# Patient Record
Sex: Female | Born: 1955 | Race: White | Hispanic: No | Smoking: Never smoker
Health system: Southern US, Community
[De-identification: ages and names within clinical notes are randomized; demographics above are authoritative.]

## PROBLEM LIST (undated history)

## (undated) DIAGNOSIS — F419 Anxiety disorder, unspecified: Secondary | ICD-10-CM

## (undated) DIAGNOSIS — E079 Disorder of thyroid, unspecified: Secondary | ICD-10-CM

## (undated) DIAGNOSIS — G43909 Migraine, unspecified, not intractable, without status migrainosus: Secondary | ICD-10-CM

---

## 1999-04-08 ENCOUNTER — Encounter: Admission: RE | Admit: 1999-04-08 | Discharge: 1999-04-08 | Payer: Self-pay | Admitting: Family Medicine

## 2008-08-03 ENCOUNTER — Ambulatory Visit: Payer: Self-pay | Admitting: Nurse Practitioner

## 2009-05-28 ENCOUNTER — Encounter: Admission: RE | Admit: 2009-05-28 | Discharge: 2009-05-28 | Payer: Self-pay | Admitting: Family Medicine

## 2010-06-04 ENCOUNTER — Encounter: Admission: RE | Admit: 2010-06-04 | Discharge: 2010-06-04 | Payer: Self-pay | Admitting: Family Medicine

## 2010-06-11 ENCOUNTER — Encounter: Admission: RE | Admit: 2010-06-11 | Discharge: 2010-06-11 | Payer: Self-pay | Admitting: Family Medicine

## 2010-09-05 ENCOUNTER — Encounter: Admission: RE | Admit: 2010-09-05 | Discharge: 2010-09-05 | Payer: Self-pay | Admitting: Family Medicine

## 2010-12-21 ENCOUNTER — Encounter
Admission: RE | Admit: 2010-12-21 | Discharge: 2010-12-21 | Payer: Self-pay | Source: Home / Self Care | Attending: Family Medicine | Admitting: Family Medicine

## 2010-12-29 ENCOUNTER — Encounter: Payer: Self-pay | Admitting: Family Medicine
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
## 2011-07-10 ENCOUNTER — Other Ambulatory Visit: Payer: Self-pay | Admitting: *Deleted

## 2011-07-10 DIAGNOSIS — N289 Disorder of kidney and ureter, unspecified: Secondary | ICD-10-CM

## 2011-07-11 ENCOUNTER — Other Ambulatory Visit: Payer: Self-pay | Admitting: Family Medicine

## 2011-07-11 DIAGNOSIS — Z1231 Encounter for screening mammogram for malignant neoplasm of breast: Secondary | ICD-10-CM

## 2011-07-12 IMAGING — OT DG DXA BONE DENSITY STUDY HL7
2 series · 2 of 2 positions shown · non-contrast
Comparison: None.

CLINICAL DATA: 54-year-old perimenopausal female who takes calcium
and vitamin D.  History of hypothyroidism.  The patient has been on
birth control pills for 3 years.

[Series 1: — · left · 1 of 1 slices shown (1 of 2)]
[im 1/1]
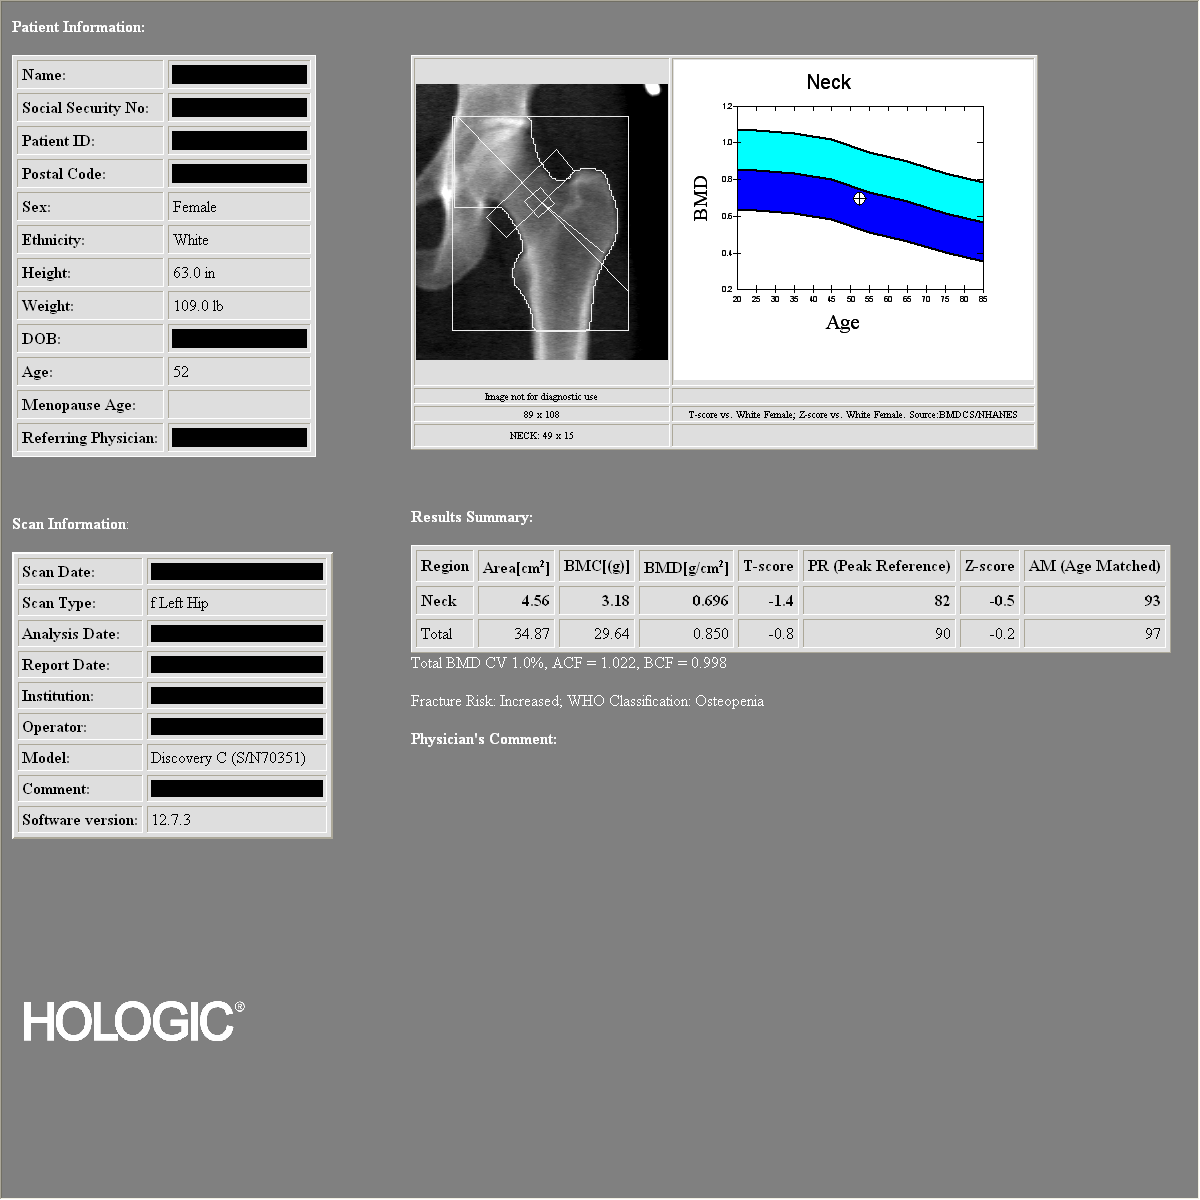

[Series 2: — · 1 of 1 slices shown (2 of 2)]
[im 1/1]
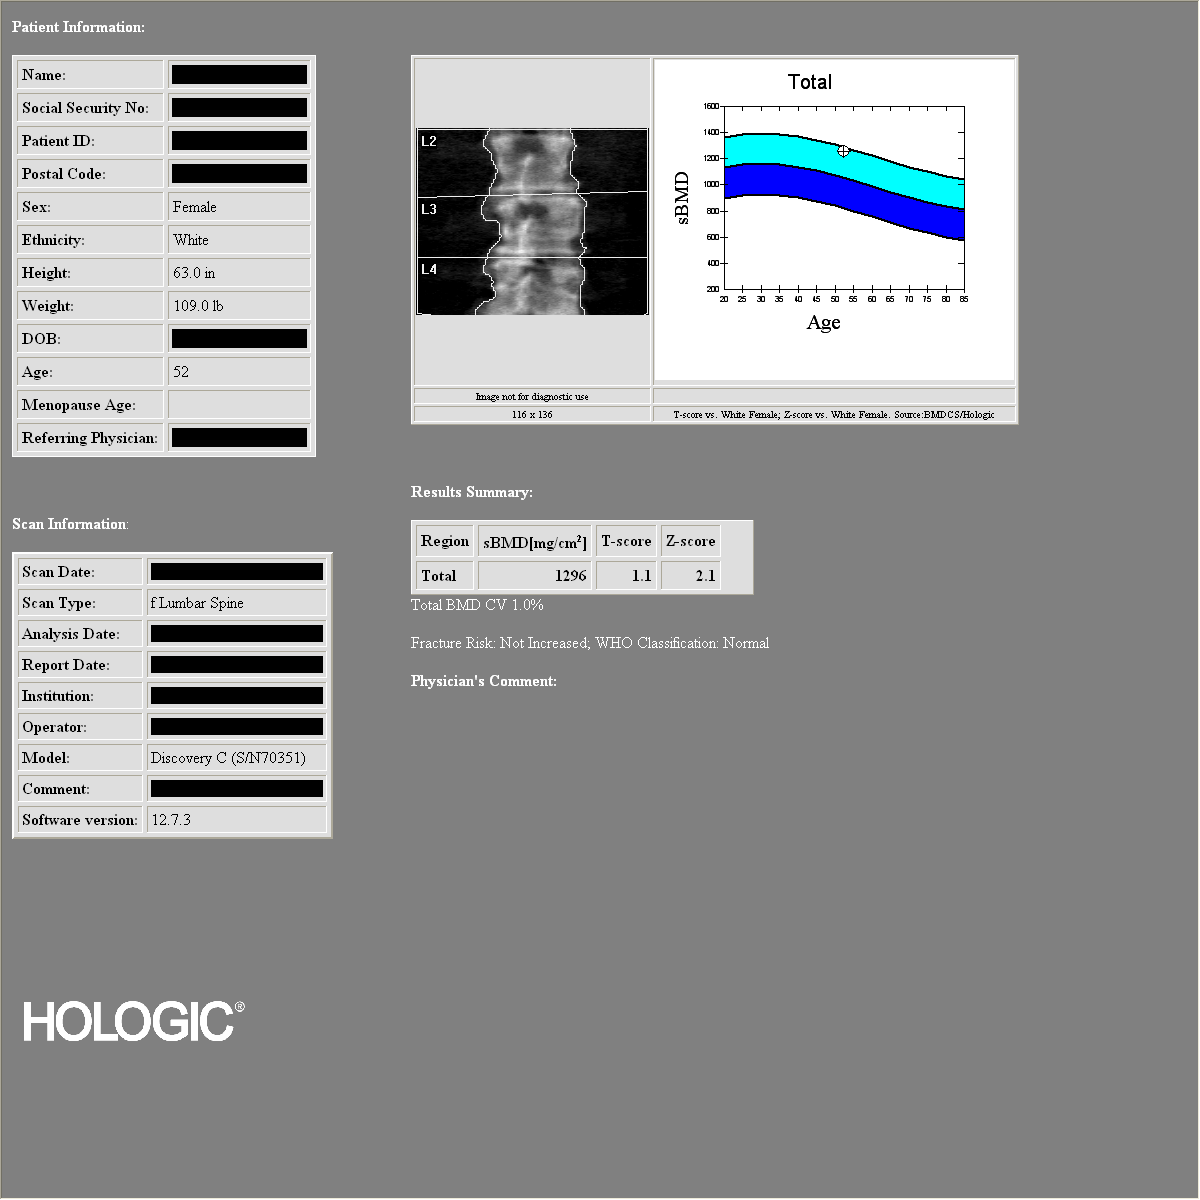

[2 of 2 positions shown; findings below may reference images not displayed]

DUAL X-RAY ABSORPTIOMETRY (DXA) FOR BONE MINERAL DENSITY

AP LUMBAR SPINE (L1 - L4)

Bone Mineral Density (BMD):            1.166 g/cm2
Young Adult T Score:
Z Score:

LEFT FEMUR (NECK)

Bone Mineral Density (BMD):             0.696 g/cm2
Young Adult T Score:                           -1.4
Z Score:                                                 -0.5

ASSESSMENT:  Patient's diagnostic category is LOW BONE MASS by WHO
Criteria.

FRACTURE RISK: MODERATE

FRAX: World Health Organization FRAX assessment of absolute
fracture risk is not calculated for this patient because the
patient is not yet postmenopausal.
RECOMMENDATIONS:

Effective therapies are available in the form of bisphosphonates,
selective estrogen receptor modulators, biologic agents, and
hormone replacement therapy (for women).  All patients should
ensure an adequate intake of dietary calcium (1200mg daily) and
vitamin D ([address]) unless contraindicated.

All treatment decisions require clinical judgement and
consideration of individual patient factors, including patient
preferences, co-morbidities, previous drug use, risk factors not
captured in the FRAX model (e.g., frailty, falls, vitamin D
deficiency, increased bone turnover, interval significant decline
in bone density) and possible under-or over-estimation of fracture
risk by FRAX.

The National Osteoporosis Foundation recommends that FDA-approved
medical therapies be considered in postmenopausal women and mean
age 50 or older with a:

      1)     Hip or vertebral (clinical or morphometric) fracture.

2)    T-score of -2.5 or lower at the spine or hip.
3)    Ten-year fracture probability by FRAX of 3% or greater for
hip fracture or 20% or greater for major osteoporotic fracture.
FOLLOW-UP:

People with diagnosed cases of osteoporosis or at high risk for
fracture should have regular bone mineral density tests.  For
patients eligible for Medicare, routine testing is allowed once
every 2 years.  The testing frequency can be increased to one year
for patients who have rapidly progressing disease, those who are
receiving or discontinuing medical therapy to restore bone mass, or
have additional risk factors.

World Health Organization (WHO) Criteria:

Normal: T scores from +1.0 to -1.0
Low Bone Mass (Osteopenia): T scores between -1.0 and -2.5
Osteoporosis: T scores -2.5 and below

Comparison to Reference Population:

T score is the key measure used in the diagnosis of osteoporosis
and relative risk determination for fracture.  It provides a value
for bone mass relative to the mean bone mass of a young adult
reference population expressed in terms of standard deviation (SD).

Z score is the age-matched score showing the patient's values
compared to a population matched for age, sex, and race.  This is
also expressed in terms of standard deviation.  The patient may
have values that compare favorably to the age-matched values and
still be at increased risk for fracture.

## 2011-07-14 ENCOUNTER — Other Ambulatory Visit: Payer: Self-pay

## 2011-07-15 ENCOUNTER — Ambulatory Visit
Admission: RE | Admit: 2011-07-15 | Discharge: 2011-07-15 | Disposition: A | Discharge status: Home / Self Care | Payer: BC Managed Care – PPO | Source: Ambulatory Visit | Attending: *Deleted | Admitting: *Deleted

## 2011-07-15 ENCOUNTER — Ambulatory Visit
Admission: RE | Admit: 2011-07-15 | Discharge: 2011-07-15 | Disposition: A | Discharge status: Home / Self Care | Payer: BC Managed Care – PPO | Source: Ambulatory Visit | Attending: Family Medicine | Admitting: Family Medicine

## 2011-07-15 DIAGNOSIS — Z1231 Encounter for screening mammogram for malignant neoplasm of breast: Secondary | ICD-10-CM

## 2011-07-15 DIAGNOSIS — N289 Disorder of kidney and ureter, unspecified: Secondary | ICD-10-CM

## 2012-06-16 ENCOUNTER — Other Ambulatory Visit: Payer: Self-pay | Admitting: Family Medicine

## 2012-06-16 DIAGNOSIS — Z1231 Encounter for screening mammogram for malignant neoplasm of breast: Secondary | ICD-10-CM

## 2012-06-24 ENCOUNTER — Other Ambulatory Visit: Payer: Self-pay | Admitting: Family Medicine

## 2012-06-24 ENCOUNTER — Ambulatory Visit (INDEPENDENT_AMBULATORY_CARE_PROVIDER_SITE_OTHER): Payer: BC Managed Care – PPO

## 2012-06-24 DIAGNOSIS — N939 Abnormal uterine and vaginal bleeding, unspecified: Secondary | ICD-10-CM

## 2012-06-24 DIAGNOSIS — N926 Irregular menstruation, unspecified: Secondary | ICD-10-CM

## 2012-08-03 ENCOUNTER — Ambulatory Visit: Payer: BC Managed Care – PPO

## 2012-08-10 ENCOUNTER — Ambulatory Visit: Payer: BC Managed Care – PPO

## 2012-08-17 ENCOUNTER — Ambulatory Visit: Payer: BC Managed Care – PPO

## 2012-12-13 ENCOUNTER — Ambulatory Visit
Admission: RE | Admit: 2012-12-13 | Discharge: 2012-12-13 | Disposition: A | Discharge status: Home / Self Care | Payer: BC Managed Care – PPO | Source: Ambulatory Visit | Attending: Family Medicine | Admitting: Family Medicine

## 2012-12-13 ENCOUNTER — Other Ambulatory Visit: Payer: Self-pay | Admitting: Family Medicine

## 2012-12-13 ENCOUNTER — Ambulatory Visit: Payer: BC Managed Care – PPO

## 2012-12-13 ENCOUNTER — Ambulatory Visit: Admission: RE | Admit: 2012-12-13 | Payer: BC Managed Care – PPO | Source: Ambulatory Visit

## 2012-12-13 DIAGNOSIS — Z1231 Encounter for screening mammogram for malignant neoplasm of breast: Secondary | ICD-10-CM

## 2012-12-20 ENCOUNTER — Ambulatory Visit: Payer: BC Managed Care – PPO

## 2013-07-25 IMAGING — US US TRANSVAGINAL NON-OB
1 series · 13 of 25 positions shown · non-contrast
Comparison: None

CLINICAL DATA: Abnormal vaginal bleeding.  Post menopausal.



[Series 1: us transvaginal non-ob · 0.28mm/px · 13 of 40 slices shown]
[im 1/40]
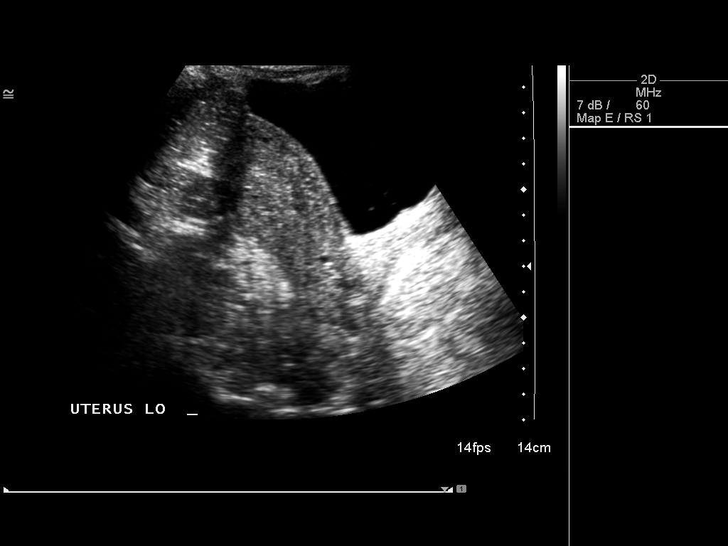
[im 4/40]
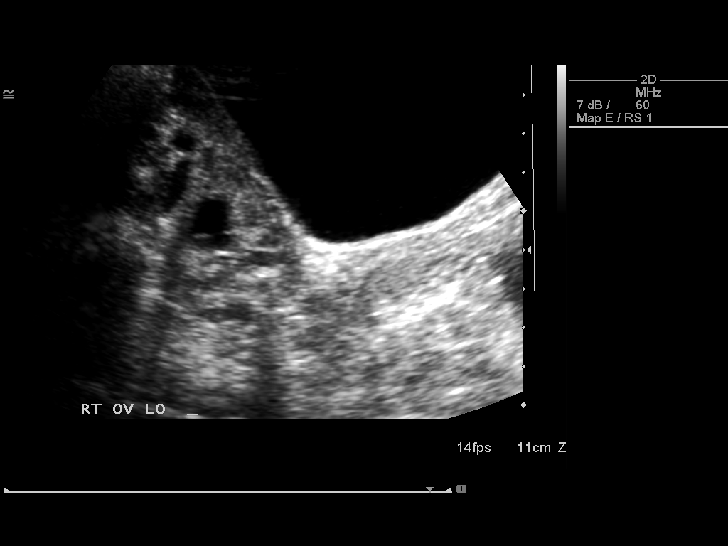
[im 7/40]
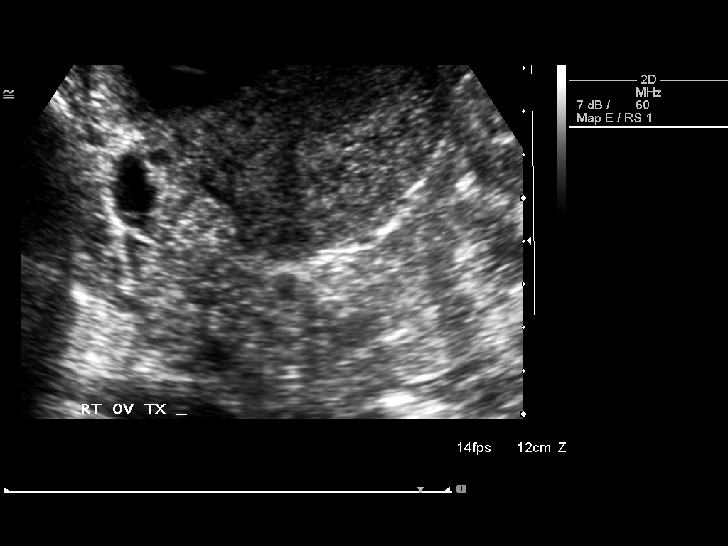
[im 10/40]
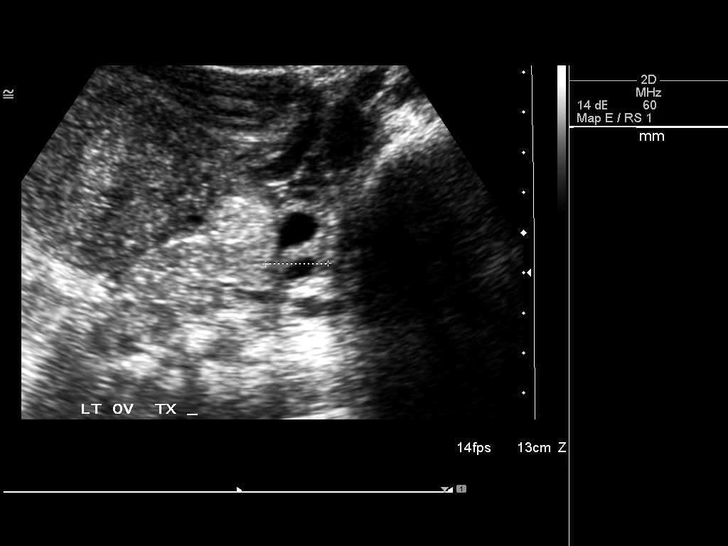
[im 14/40]
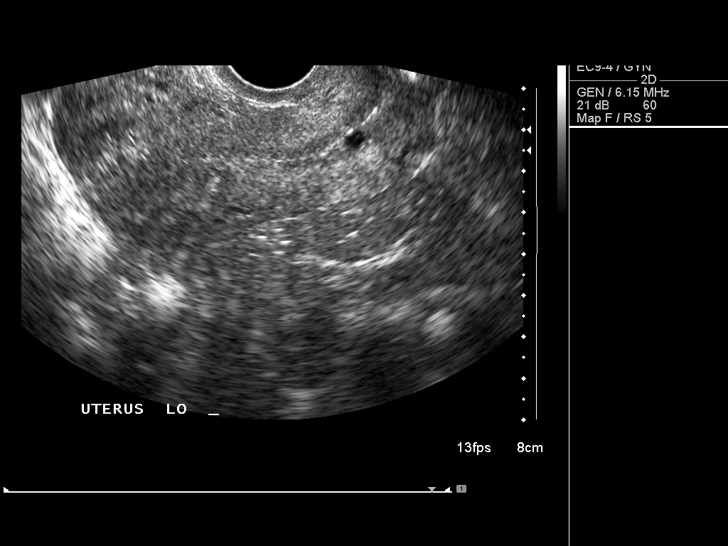
[im 17/40]
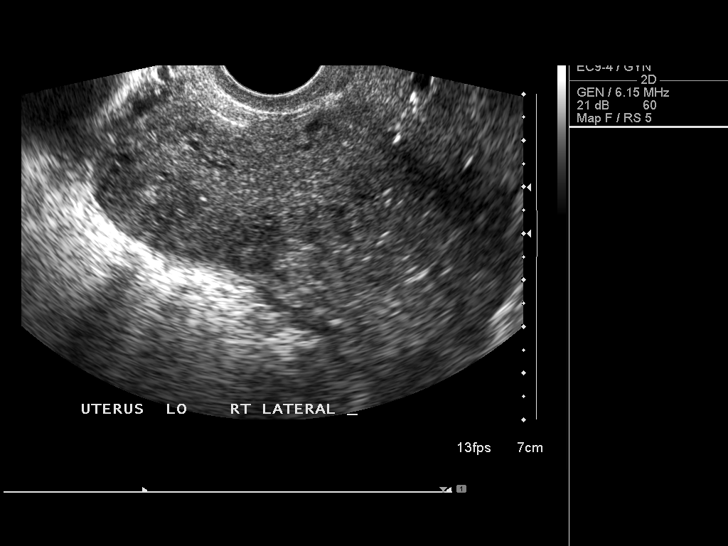
[im 20/40]
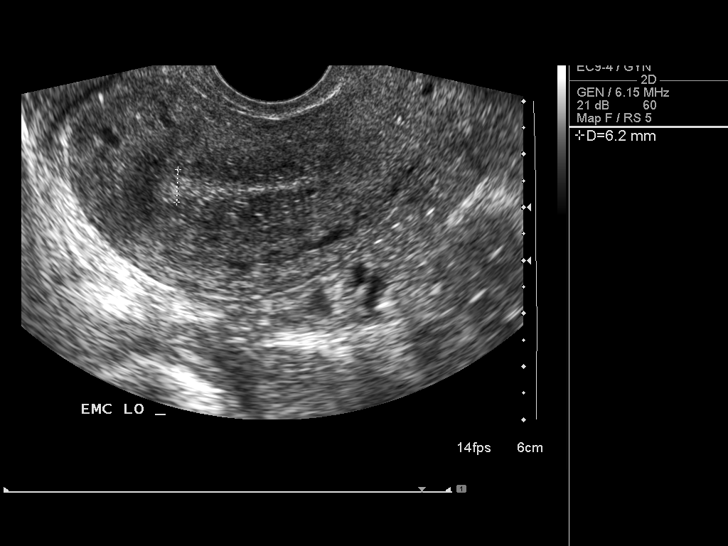
[im 23/40]
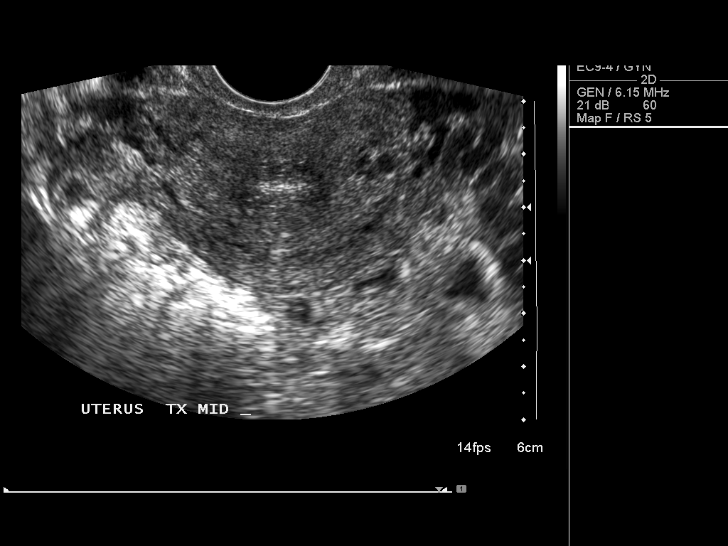
[im 27/40]
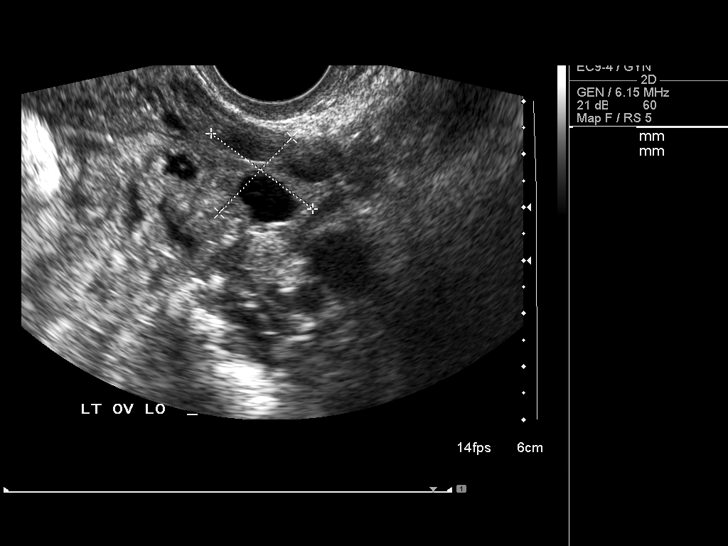
[im 30/40]
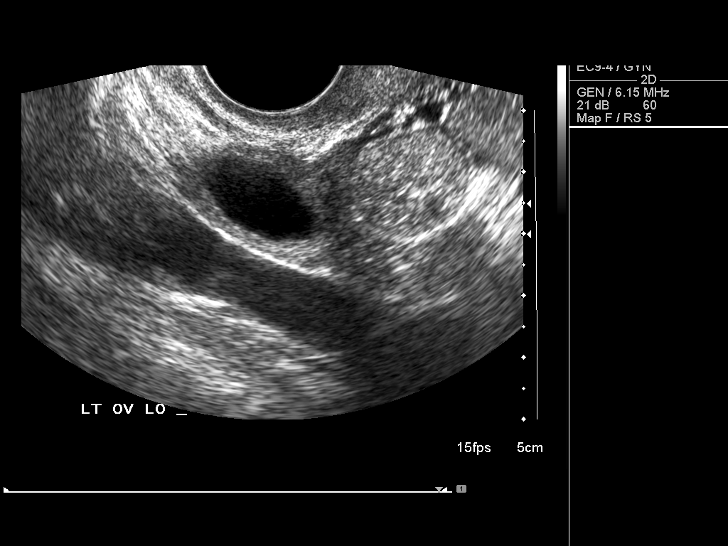
[im 33/40]
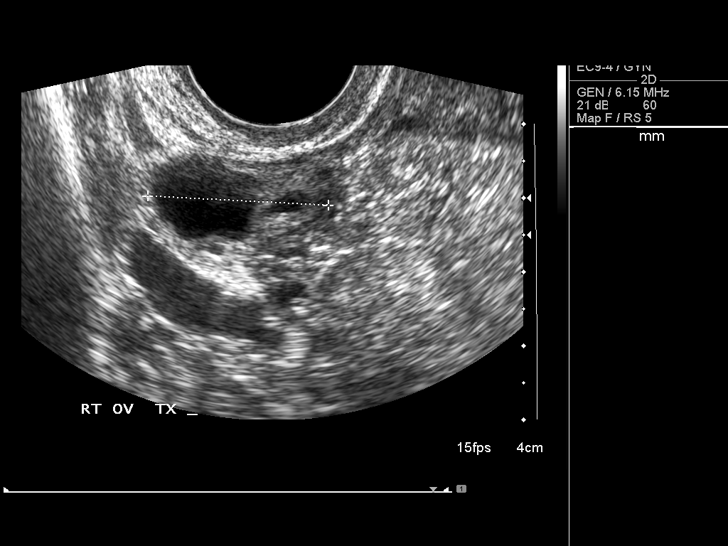
[im 36/40]
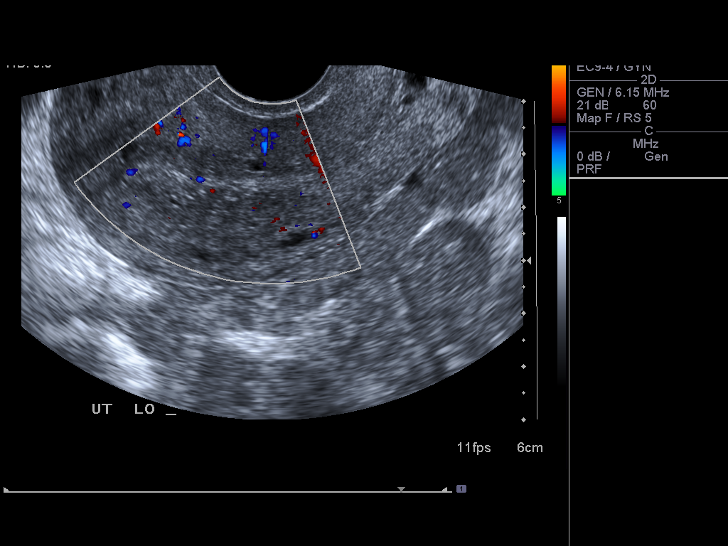
[im 40/40]
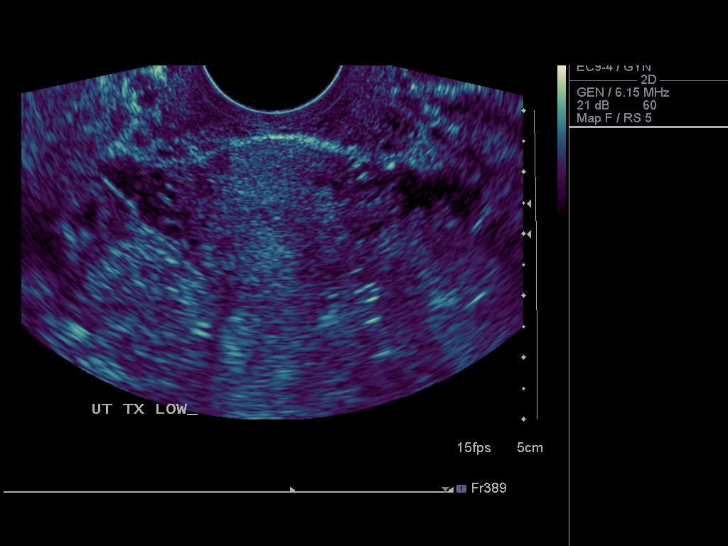

[13 of 25 positions shown; findings below may reference images not displayed]

FINDINGS: Uterus: 9.3 x 4.7 x 6.1 cm. Normal in morphology.

Endometrium: Within normal limits in the body.  Within the uterine
fundus, focally prominent at between eight and 12 mm on images 41
and 38.  Mild endometrial heterogeneity within this region.

Right ovary:  3.1 x 1.8 x 2.4 cm.  Numerous follicles within.

Left ovary: 2.4 x 2.0-0.5 cm.  Numerous follicles identified
within.

Other findings: No free fluid
IMPRESSION: Focal prominence of the endometrium in the uterine fundus at
between 8 and 12 mm.  If the patient it is post menopausal with
bleeding, this is abnormal.  Considerations include polyp,
hyperplasia, or less likely carcinoma.  Further evaluation with
sampling or sonohysterogram would be suggested.  If the patient is
premenopausal or perimenopausal, this could be within normal
variation and follow-up performed.  The clinical history describes
the patient as post menopausal, but the ovarian size and morphology
suggest premenopausal or perimenopausal status. .

## 2014-02-16 ENCOUNTER — Other Ambulatory Visit: Payer: Self-pay

## 2014-02-16 DIAGNOSIS — Z1231 Encounter for screening mammogram for malignant neoplasm of breast: Secondary | ICD-10-CM

## 2020-02-19 ENCOUNTER — Encounter: Payer: Self-pay | Admitting: Emergency Medicine

## 2020-02-19 ENCOUNTER — Emergency Department (INDEPENDENT_AMBULATORY_CARE_PROVIDER_SITE_OTHER): Payer: BC Managed Care – PPO

## 2020-02-19 ENCOUNTER — Emergency Department
Admission: EM | Admit: 2020-02-19 | Discharge: 2020-02-19 | Disposition: A | Discharge status: Home / Self Care | Payer: BC Managed Care – PPO | Source: Home / Self Care

## 2020-02-19 ENCOUNTER — Other Ambulatory Visit: Payer: Self-pay

## 2020-02-19 DIAGNOSIS — R0781 Pleurodynia: Secondary | ICD-10-CM

## 2020-02-19 DIAGNOSIS — S20211A Contusion of right front wall of thorax, initial encounter: Secondary | ICD-10-CM

## 2020-02-19 HISTORY — DX: Anxiety disorder, unspecified: F41.9

## 2020-02-19 HISTORY — DX: Disorder of thyroid, unspecified: E07.9

## 2020-02-19 HISTORY — DX: Migraine, unspecified, not intractable, without status migrainosus: G43.909

## 2020-02-19 NOTE — ED Triage Notes (Signed)
Patient reports falling forward over edge of truck sidewall and catching herself along right breast and underlying ribs; now painful to take deep breath and to twist side to side. Took ibuprofen 400mg  po 2 hours ago after incident. Has had influenza vacc this season; has had 1st covid immunization. No known exposure to covid positive person.

## 2020-02-19 NOTE — ED Provider Notes (Signed)
[facility]    CSN: [id number] Arrival date & time: 02/19/20  1510      History   Chief Complaint Chief Complaint  Patient presents with  . Breast Problem  . Chest Pain    ribs    HPI Kristin Gordon is a 64 y.o. female.   Patient suffered a fall while loading some furniture in the back of a dump truck.  She stepped on a folding chair that folded up and she landed on her right chest/breast/ribs.  Now hurts to take a deep breath.  HPI  No past medical history on file.  There are no problems to display for this patient.   No past surgical history on file.  OB History   No obstetric history on file.      Home Medications    Prior to Admission medications   Not on File    Family History No family history on file.  Social History Social History   Tobacco Use  . Smoking status: Not on file  Substance Use Topics  . Alcohol use: Not on file  . Drug use: Not on file     Allergies   Patient has no known allergies.   Review of Systems Review of Systems  Cardiovascular: Positive for chest pain.  All other systems reviewed and are negative.    Physical Exam Triage Vital Signs ED Triage Vitals  Enc Vitals Group     BP 02/19/20 1531 138/78     Pulse Rate 02/19/20 1531 61     Resp 02/19/20 1531 (!) 160     Temp 02/19/20 1531 98.4 F (36.9 C)     Temp Source 02/19/20 1531 Oral     SpO2 02/19/20 1531 100 %     Weight 02/19/20 1533 101 lb (45.8 kg)     Height 02/19/20 1533 5\' 3"  (1.6 m)     Head Circumference --      Peak Flow --      Pain Score 02/19/20 1532 8     Pain Loc --      Pain Edu? --      Excl. in [place]? --    No data found.  Updated Vital Signs BP 138/78 (BP Location: Right Arm)   Pulse 61   Temp 98.4 F (36.9 C) (Oral)   Resp (!) 160   Ht 5\' 3"  (1.6 m)   Wt 45.8 kg   SpO2 100%   BMI 17.89 kg/m   Visual Acuity Right Eye Distance:   Left Eye Distance:   Bilateral Distance:    Right Eye Near:   Left Eye Near:      Bilateral Near:     Physical Exam Vitals and nursing note reviewed.  Constitutional:      Appearance: She is well-developed.  Pulmonary:     Effort: Pulmonary effort is normal.  Chest:     Comments: Tender right side anteriorly.  No bruising noted.  There is tenderness with compression of the ribs laterally. Breath sounds are normal Neurological:     Mental Status: She is alert.      UC Treatments / Results  Labs (all labs ordered are listed, but only abnormal results are displayed) Labs Reviewed - No data to display  EKG   Radiology chest x-ray shows no evidence of rib fracture No results found.  Procedures Procedures (including critical care time)  Medications Ordered in UC Medications - No data to display  Initial Impression / Assessment  and Plan / UC Course  I have reviewed the triage vital signs and the nursing notes.  Pertinent labs & imaging results that were available during my care of the patient were reviewed by me and considered in my medical decision making (see chart for details).     Contusion right chest wall Final Clinical Impressions(s) / UC Diagnoses   Final diagnoses:  None   Discharge Instructions   None    ED Prescriptions    None     PDMP not reviewed this encounter.   Frederica Kuster, MD 02/19/20 [phone]

## 2021-03-21 IMAGING — DX DG RIBS W/ CHEST 3+V*R*
3 series · 3 of 3 positions shown · non-contrast
Comparison: None.

CLINICAL DATA: Fall, right rib pain

EXAM:
RIGHT RIBS AND CHEST - 3+ VIEW

[chest pa]
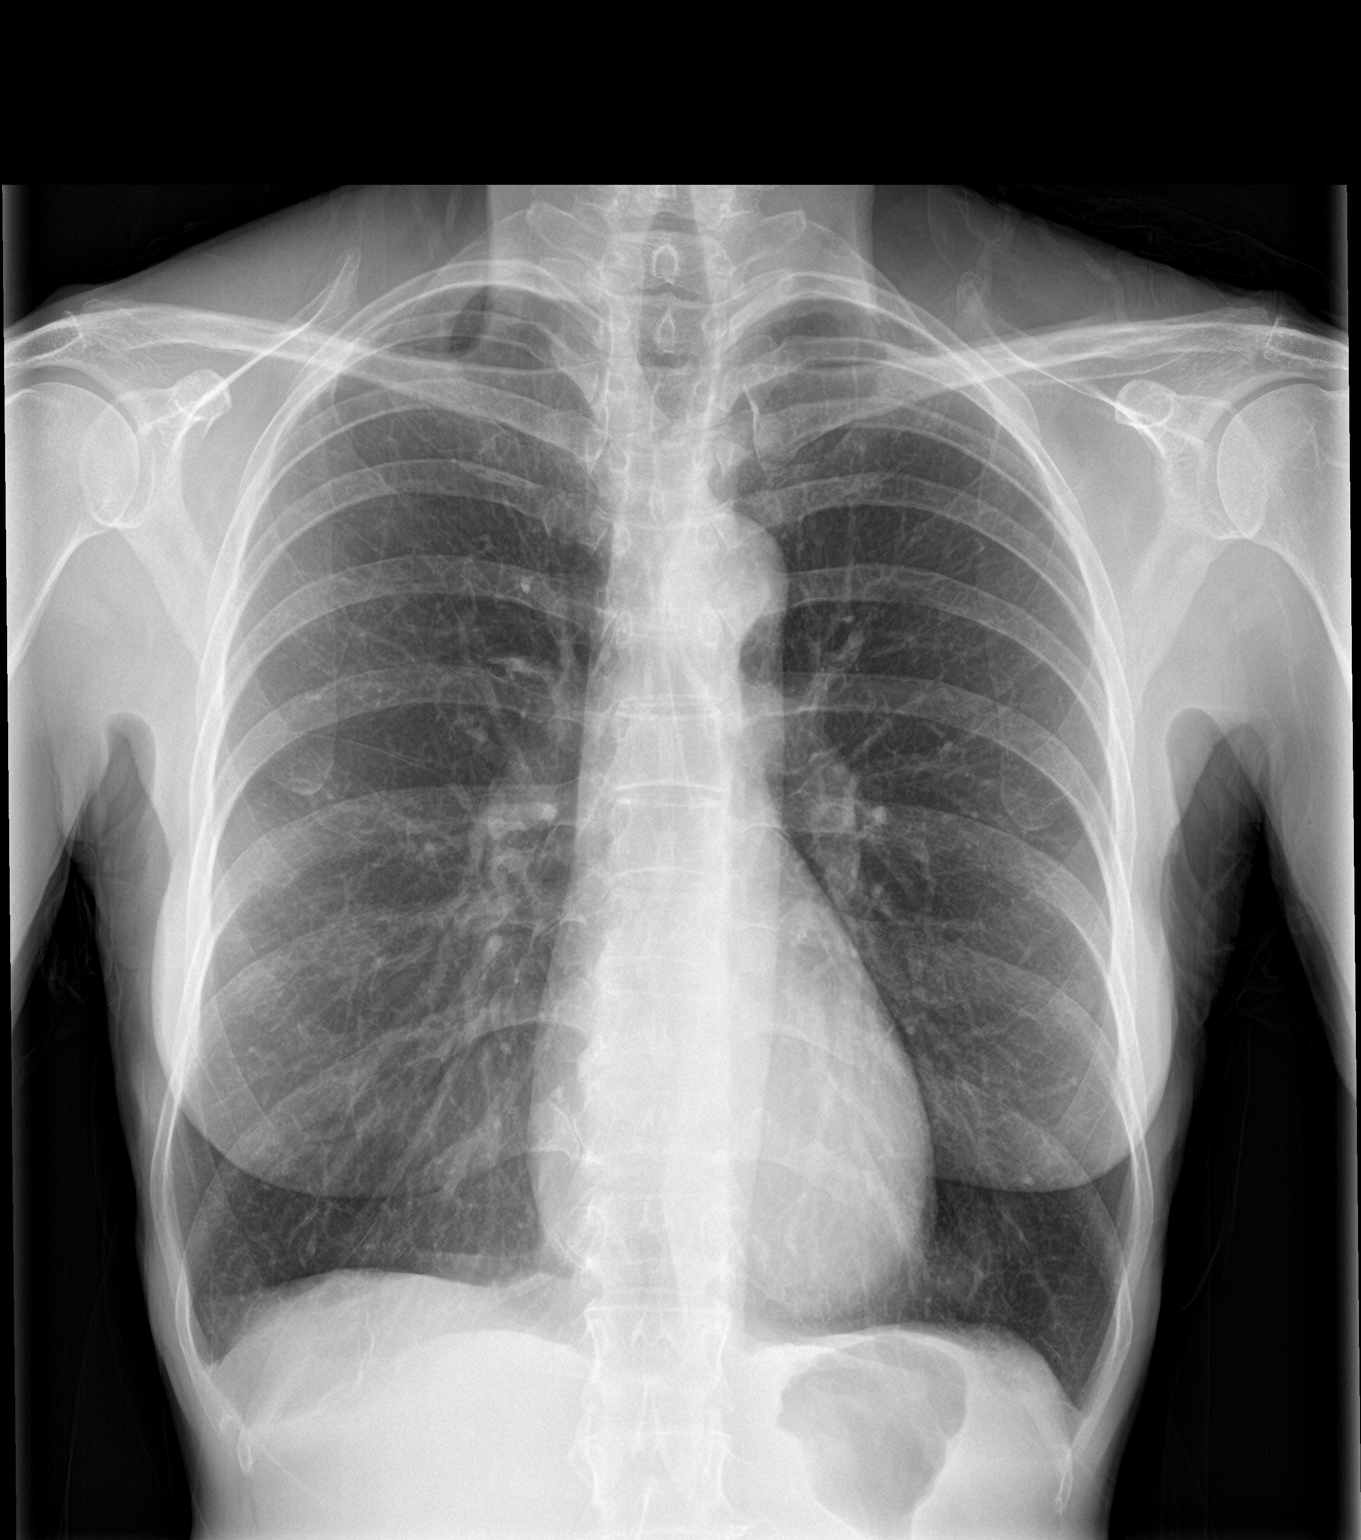

[rib pa]
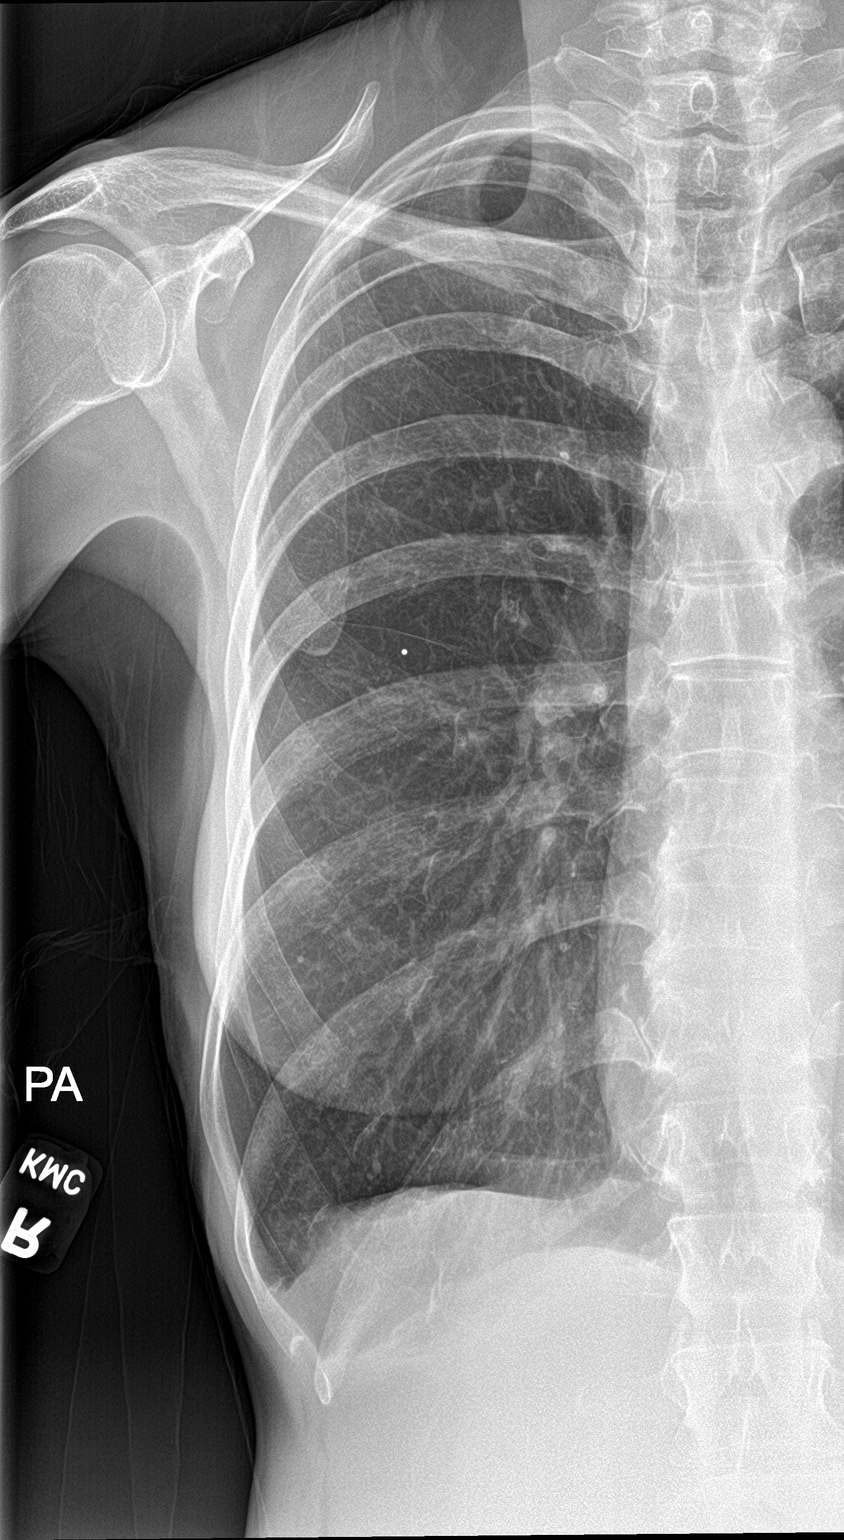

[rib pa obl]
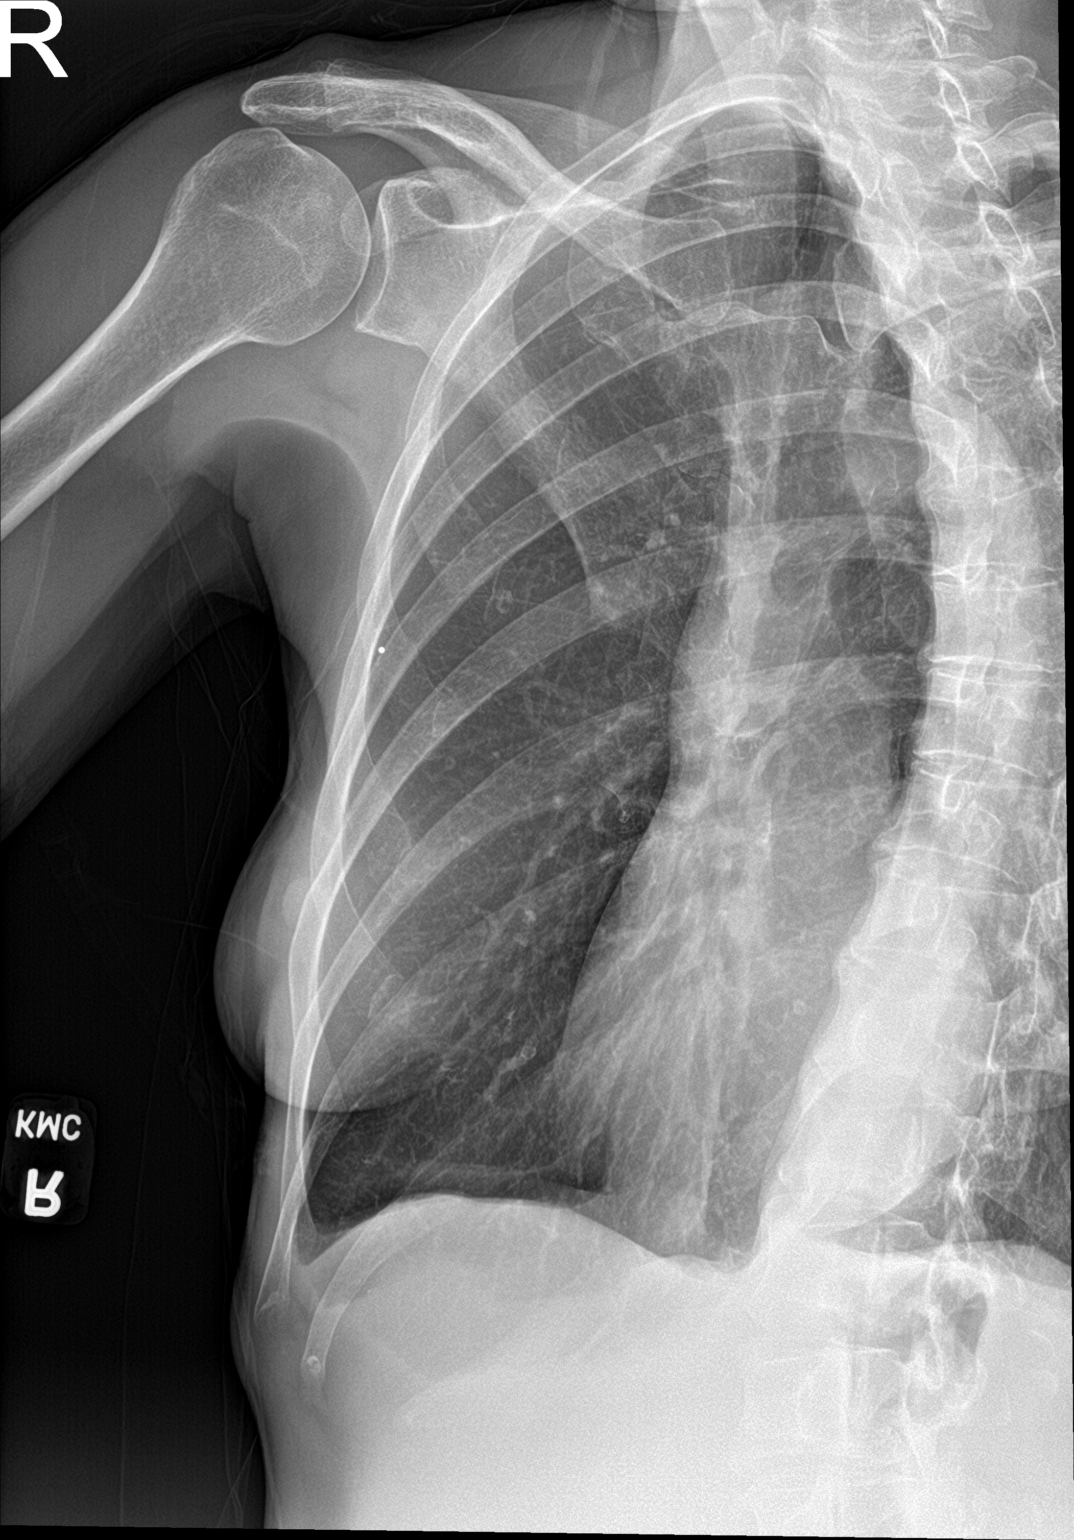

[3 of 3 positions shown; findings below may reference images not displayed]

FINDINGS: No fracture or other bone lesions are seen involving the ribs. There
is no evidence of pneumothorax or pleural effusion. Both lungs are
clear. Heart size and mediastinal contours are within normal limits.
IMPRESSION: No displaced fracture or dislocation of the right ribs.

## 2021-08-13 ENCOUNTER — Other Ambulatory Visit: Payer: Self-pay

## 2021-08-13 ENCOUNTER — Emergency Department (INDEPENDENT_AMBULATORY_CARE_PROVIDER_SITE_OTHER)
Admission: EM | Admit: 2021-08-13 | Discharge: 2021-08-13 | Disposition: A | Discharge status: Home / Self Care | Payer: Worker's Compensation | Source: Home / Self Care

## 2021-08-13 DIAGNOSIS — S81812A Laceration without foreign body, left lower leg, initial encounter: Secondary | ICD-10-CM

## 2021-08-13 DIAGNOSIS — Z23 Encounter for immunization: Secondary | ICD-10-CM | POA: Diagnosis not present

## 2021-08-13 MED ORDER — CEFDINIR 300 MG PO CAPS: 300.0000 mg | ORAL_CAPSULE | Freq: Two times a day (BID) | ORAL | 0 refills | Status: AC

## 2021-08-13 MED ORDER — TETANUS-DIPHTH-ACELL PERTUSSIS 5-2.5-18.5 LF-MCG/0.5 IM SUSY
0.5000 mL | PREFILLED_SYRINGE | Freq: Once | INTRAMUSCULAR | Status: AC
  Administered 2021-08-13: 0.5 mL via INTRAMUSCULAR

## 2021-08-13 NOTE — Discharge Instructions (Addendum)
Advised/instructed patient to keep wound area dry and clean for the next 36 hours, then can get wet; however, do not submerge wound underwater.  After initial 36 hours please leave wound open to air is much as possible to heal by secondary intention.  Advised patient to take medication as directed with food to completion.  Encourage patient to increase daily water intake while taking this medication.  Advised patient RTC in 7 to 10 days for suture removal.

## 2021-08-13 NOTE — ED Triage Notes (Signed)
Pt c/o laceration to LT shin. Walked into a metal platform on the playground about 130pm. Unsure if tdap is up to date.

## 2021-08-13 NOTE — ED Provider Notes (Signed)
[facility]    CSN: [id number] Arrival date & time: 08/13/21  1557      History   Chief Complaint Chief Complaint  Patient presents with   Laceration    LT shin    HPI Kristin Gordon is a 65 y.o. female.   HPI 65 year old female presents with laceration/skin avulsion to left shin, reports accidentally walked into a metal platform on playground at 1:30 PM this afternoon.  Past Medical History:  Diagnosis Date   Anxiety    Migraine    Thyroid disease     There are no problems to display for this patient.   History reviewed. No pertinent surgical history.  OB History   No obstetric history on file.      Home Medications    Prior to Admission medications   Medication Sig Start Date End Date Taking? Authorizing Provider  cefdinir (OMNICEF) 300 MG capsule Take 1 capsule (300 mg total) by mouth 2 (two) times daily for 7 days. 08/13/21 08/20/21 Yes Trevor Iha, FNP  AJOVY 225 MG/1.5ML SOSY Inject into the skin. 08/10/21   [provider]  buPROPion (WELLBUTRIN SR) 200 MG 12 hr tablet Take 250 mg by mouth 2 (two) times daily.    [provider]  levothyroxine (SYNTHROID) 25 MCG tablet Take 25 mcg by mouth daily before breakfast.    [provider]  SUMAtriptan (IMITREX) 100 MG tablet Take 100 mg by mouth every 2 (two) hours as needed for migraine. May repeat in 2 hours if headache persists or recurs.    [provider]  topiramate (TOPAMAX) 100 MG tablet Take 100 mg by mouth 2 (two) times daily.    [provider]    Family History History reviewed. No pertinent family history.  Social History Social History   Tobacco Use   Smoking status: Never   Smokeless tobacco: Never  Vaping Use   Vaping Use: Never used     Allergies   Patient has no known allergies.   Review of Systems Review of Systems  Skin:  Positive for wound.    Physical Exam Triage Vital Signs ED Triage Vitals  Enc Vitals Group      BP 08/13/21 1610 (!) 151/76     Pulse Rate 08/13/21 1610 71     Resp 08/13/21 1610 18     Temp 08/13/21 1610 98.3 F (36.8 C)     Temp Source 08/13/21 1610 Oral     SpO2 08/13/21 1610 98 %     Weight --      Height --      Head Circumference --      Peak Flow --      Pain Score 08/13/21 1611 5     Pain Loc --      Pain Edu? --      Excl. in [place]? --    No data found.  Updated Vital Signs BP (!) 151/76 (BP Location: Right Arm)   Pulse 71   Temp 98.3 F (36.8 C) (Oral)   Resp 18   SpO2 98%    Physical Exam Vitals and nursing note reviewed.  Constitutional:      General: She is not in acute distress.    Appearance: Normal appearance. She is normal weight. She is not ill-appearing.  HENT:     Head: Normocephalic and atraumatic.     Mouth/Throat:     Mouth: Mucous membranes are moist.     Pharynx: Oropharynx is clear.  Eyes:     Extraocular Movements: Extraocular movements intact.     Conjunctiva/sclera: Conjunctivae normal.     Pupils: Pupils are equal, round, and reactive to light.  Cardiovascular:     Rate and Rhythm: Normal rate and regular rhythm.     Pulses: Normal pulses.     Heart sounds: Normal heart sounds.  Pulmonary:     Effort: Pulmonary effort is normal.     Breath sounds: Normal breath sounds.     Comments: No adventitious breath sounds noted Musculoskeletal:        General: Normal range of motion.     Cervical back: Normal range of motion and neck supple.  Skin:    General: Skin is warm and dry.     Comments: Left lower leg (mid anterior aspect) 1.5 cm x 3 mm linear laceration with minimal bleeding noted  Neurological:     General: No focal deficit present.     Mental Status: She is alert and oriented to person, place, and time. Mental status is at baseline.  Psychiatric:        Mood and Affect: Mood normal.        Behavior: Behavior normal.        Thought Content: Thought content normal.     UC Treatments / Results  Labs (all labs ordered  are listed, but only abnormal results are displayed) Labs Reviewed - No data to display  EKG   Radiology No results found.  Procedures Laceration Repair  Date/Time: 08/13/2021 9:04 PM Performed by: Trevor Iha, FNP Authorized by: Trevor Iha, FNP   Consent:    Consent obtained:  Verbal   Consent given by:  Patient   Risks discussed:  Infection, need for additional repair, pain, poor cosmetic result and poor wound healing   Alternatives discussed:  No treatment and delayed treatment Universal protocol:    Procedure explained and questions answered to patient or proxy's satisfaction: yes     Relevant documents present and verified: yes     Test results available: yes     Imaging studies available: yes     Required blood products, implants, devices, and special equipment available: yes     Site/side marked: yes     Immediately prior to procedure, a time out was called: yes     Patient identity confirmed:  Verbally with patient Anesthesia:    Anesthesia method:  Local infiltration Laceration details:    Location:  Leg   Leg location:  L lower leg   Length (cm):  1.5   Depth (mm):  3 Pre-procedure details:    Preparation:  Patient was prepped and draped in usual sterile fashion Exploration:    Limited defect created (wound extended): no     Hemostasis achieved with:  Direct pressure   Contaminated: no   Treatment:    Area cleansed with:  Soap and water   Irrigation solution:  Sterile saline   Visualized foreign bodies/material removed: no     Debridement:  None Skin repair:    Repair method:  Sutures   Suture size:  4-0   Suture material:  Prolene   Suture technique:  Simple interrupted   Number of sutures:  5 Approximation:    Approximation:  Close Repair type:    Repair type:  Simple Post-procedure details:    Dressing:  Adhesive bandage   Procedure completion:  Tolerated well, no immediate complications Comments:     RTC in 7-10 for suture removal  (including  critical care time)  Medications Ordered in UC Medications  Tdap (BOOSTRIX) injection 0.5 mL (0.5 mLs Intramuscular Given 08/13/21 1621)    Initial Impression / Assessment and Plan / UC Course  I have reviewed the triage vital signs and the nursing notes.  Pertinent labs & imaging results that were available during my care of the patient were reviewed by me and considered in my medical decision making (see chart for details).     MDM: 1.  Laceration of left lower extremity, initial encounter-laceration repair, Rx'd Cefdinir; Advised/instructed patient to keep wound area dry and clean for the next 36 hours, then can get wet; however, do not submerge wound underwater.  After initial 36 hours please leave wound open to air is much as possible to heal by secondary intention.  Advised patient to take medication as directed with food to completion.  Encourage patient to increase daily water intake while taking this medication.  RTC in 7 to 10 days for suture removal. Patient discharged home, hemodynamically stable. Final Clinical Impressions(s) / UC Diagnoses   Final diagnoses:  Laceration of left lower extremity, initial encounter     Discharge Instructions      Advised/instructed patient to keep wound area dry and clean for the next 36 hours, then can get wet; however, do not submerge wound underwater.  After initial 36 hours please leave wound open to air is much as possible to heal by secondary intention.  Advised patient to take medication as directed with food to completion.  Encourage patient to increase daily water intake while taking this medication.  Advised patient RTC in 7 to 10 days for suture removal.     ED Prescriptions     Medication Sig Dispense Auth. Provider   cefdinir (OMNICEF) 300 MG capsule Take 1 capsule (300 mg total) by mouth 2 (two) times daily for 7 days. 14 capsule Trevor Iha, FNP      PDMP not reviewed this encounter.   Trevor Iha,  FNP 08/13/21 2107

## 2021-08-23 ENCOUNTER — Encounter: Payer: Self-pay | Admitting: Emergency Medicine

## 2021-08-23 ENCOUNTER — Other Ambulatory Visit: Payer: Self-pay

## 2021-08-23 ENCOUNTER — Emergency Department
Admission: EM | Admit: 2021-08-23 | Discharge: 2021-08-23 | Disposition: A | Discharge status: Home / Self Care | Payer: Worker's Compensation | Source: Home / Self Care

## 2021-08-23 DIAGNOSIS — Z4802 Encounter for removal of sutures: Secondary | ICD-10-CM

## 2021-08-23 NOTE — ED Triage Notes (Signed)
Here for suture removal - placed on 08/13/21
